# Patient Record
Sex: Female | Born: 1990 | Hispanic: Yes | Smoking: Never smoker
Health system: Southern US, Community
[De-identification: ages and names within clinical notes are randomized; demographics above are authoritative.]

## PROBLEM LIST (undated history)

## (undated) DIAGNOSIS — Z789 Other specified health status: Secondary | ICD-10-CM

## (undated) HISTORY — PX: NO PAST SURGERIES: SHX2092

---
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
## 2010-12-15 LAB — URINE MICROSCOPIC-ADD ON

## 2010-12-15 LAB — CBC
Hemoglobin: 11.7 g/dL — ABNORMAL LOW (ref 12.0–15.0)
Hemoglobin: 9.2 g/dL — ABNORMAL LOW (ref 12.0–15.0)
MCHC: 32.5 g/dL (ref 30.0–36.0)
MCHC: 33.2 g/dL (ref 30.0–36.0)
MCV: 83.3 fL (ref 78.0–100.0)
RBC: 4.34 MIL/uL (ref 3.87–5.11)
RDW: 14 % (ref 11.5–15.5)
WBC: 11.6 10*3/uL — ABNORMAL HIGH (ref 4.0–10.5)

## 2010-12-15 LAB — URINALYSIS, ROUTINE W REFLEX MICROSCOPIC
Bilirubin Urine: NEGATIVE
Ketones, ur: NEGATIVE mg/dL
Protein, ur: NEGATIVE mg/dL
Urobilinogen, UA: 0.2 mg/dL (ref 0.0–1.0)

## 2010-12-15 LAB — URINE CULTURE: Colony Count: 9000

## 2011-07-10 LAB — URINALYSIS, ROUTINE W REFLEX MICROSCOPIC
Bilirubin Urine: NEGATIVE
Glucose, UA: NEGATIVE
Ketones, ur: NEGATIVE
pH: 7.5

## 2011-07-10 LAB — CBC
Platelets: 319
RBC: 4.51
RDW: 12.9
WBC: 8.6

## 2011-07-10 LAB — URINE MICROSCOPIC-ADD ON

## 2011-07-10 LAB — DIFFERENTIAL
Basophils Absolute: 0
Basophils Relative: 0
Lymphocytes Relative: 15 — ABNORMAL LOW
Monocytes Absolute: 0.7
Neutro Abs: 6.1
Neutrophils Relative %: 71 — ABNORMAL HIGH

## 2011-07-10 LAB — BASIC METABOLIC PANEL
Chloride: 106
Potassium: 3.5
Sodium: 137

## 2011-09-10 ENCOUNTER — Encounter: Payer: Self-pay | Admitting: *Deleted

## 2011-09-10 ENCOUNTER — Emergency Department (HOSPITAL_COMMUNITY)
Admission: EM | Admit: 2011-09-10 | Discharge: 2011-09-10 | Disposition: A | Discharge status: Home / Self Care | Payer: Self-pay | Attending: Emergency Medicine | Admitting: Emergency Medicine

## 2011-09-10 DIAGNOSIS — R070 Pain in throat: Secondary | ICD-10-CM | POA: Insufficient documentation

## 2011-09-10 DIAGNOSIS — R220 Localized swelling, mass and lump, head: Secondary | ICD-10-CM | POA: Insufficient documentation

## 2011-09-10 DIAGNOSIS — J020 Streptococcal pharyngitis: Secondary | ICD-10-CM | POA: Insufficient documentation

## 2011-09-10 DIAGNOSIS — R599 Enlarged lymph nodes, unspecified: Secondary | ICD-10-CM | POA: Insufficient documentation

## 2011-09-10 MED ORDER — PENICILLIN G BENZATHINE 1200000 UNIT/2ML IM SUSP
1.2000 10*6.[IU] | INTRAMUSCULAR | Status: AC
  Administered 2011-09-10: 1.2 10*6.[IU] via INTRAMUSCULAR
  Filled 2011-09-10: qty 2

## 2011-09-10 NOTE — ED Notes (Signed)
Pt reports sore throat since Friday, last night felt like throat was swelling, had difficulty swallowing. Airway is intact at triage, resp e/u. Skin w/d.

## 2011-09-10 NOTE — ED Provider Notes (Addendum)
History     CSN: [id number] Arrival date & time: 09/10/2011  1:45 PM   First MD Initiated Contact with Patient 09/10/11 1439      Chief Complaint  Patient presents with  . Sore Throat    (Consider location/radiation/quality/duration/timing/severity/associated sxs/prior treatment) HPI  History reviewed. No pertinent past medical history.  History reviewed. No pertinent past surgical history.  History reviewed. No pertinent family history.  History  Substance Use Topics  . Smoking status: Never Smoker   . Smokeless tobacco: Not on file  . Alcohol Use: No    OB History    Grav Para Term Preterm Abortions TAB SAB Ect Mult Living                  Review of Systems  All other systems reviewed and are negative.    Allergies  Review of patient's allergies indicates no known allergies.  Home Medications   Current Outpatient Rx  Name Route Sig Dispense Refill  . HYPROMELLOSE 2.5 % OP SOLN Both Eyes Place 1 drop into both eyes daily as needed. For dry/irritated eyes       BP 127/76  Pulse 115  Temp(Src) 100.5 F (38.1 C) (Oral)  Resp 20  SpO2 97%  LMP 09/05/2011  Physical Exam  Nursing note and vitals reviewed. Constitutional: She is oriented to person, place, and time. She appears well-developed and well-nourished. No distress.  HENT:  Head: Normocephalic and atraumatic.  Mouth/Throat: Uvula is midline. Posterior oropharyngeal edema and posterior oropharyngeal erythema present. No tonsillar abscesses.  Eyes: Pupils are equal, round, and reactive to light.  Neck: Normal range of motion.  Cardiovascular: Normal rate and intact distal pulses.   Pulmonary/Chest: No respiratory distress.  Abdominal: Normal appearance. She exhibits no distension.  Musculoskeletal: Normal range of motion.  Lymphadenopathy:    She has cervical adenopathy.  Neurological: She is alert and oriented to person, place, and time. No cranial nerve deficit.  Skin: Skin is warm and dry.  No rash noted.  Psychiatric: She has a normal mood and affect. Her behavior is normal.    ED Course  Procedures (including critical care time)   Labs Reviewed  RAPID STREP SCREEN   No results found.  Centor Score =4 1. Strep pharyngitis       MDM  Will treat with IM Penecillin         Nelia Shi, MD 09/10/11 1500  Nelia Shi, MD 09/10/11 1501

## 2013-07-25 LAB — OB RESULTS CONSOLE HEPATITIS B SURFACE ANTIGEN: Hepatitis B Surface Ag: NEGATIVE

## 2013-07-25 LAB — OB RESULTS CONSOLE RUBELLA ANTIBODY, IGM: RUBELLA: IMMUNE

## 2013-09-25 NOTE — L&D Delivery Note (Signed)
Patient was C/C/+2and pushed for <10 minutes with epidural.   NSVD female infant, Apgars 9/9, weight pending.   The patient had no lacerations. Fundus was firm. EBL was expected. Placenta was delivered intact. Vagina was clear.  Baby was vigorous and doing skin to skin with mother.  Tilman Mcclaren  

## 2013-11-01 ENCOUNTER — Encounter (HOSPITAL_COMMUNITY): Payer: Medicaid Other | Admitting: Anesthesiology

## 2013-11-01 ENCOUNTER — Encounter (HOSPITAL_COMMUNITY): Payer: Self-pay | Admitting: *Deleted

## 2013-11-01 ENCOUNTER — Inpatient Hospital Stay (HOSPITAL_COMMUNITY): Payer: Medicaid Other | Admitting: Anesthesiology

## 2013-11-01 ENCOUNTER — Inpatient Hospital Stay (HOSPITAL_COMMUNITY)
Admission: AD | Admit: 2013-11-01 | Discharge: 2013-11-02 | DRG: 775 | Disposition: A | Discharge status: Home / Self Care | Payer: Medicaid Other | Source: Ambulatory Visit | Attending: Obstetrics and Gynecology | Admitting: Obstetrics and Gynecology

## 2013-11-01 DIAGNOSIS — O99892 Other specified diseases and conditions complicating childbirth: Principal | ICD-10-CM | POA: Diagnosis present

## 2013-11-01 DIAGNOSIS — O0930 Supervision of pregnancy with insufficient antenatal care, unspecified trimester: Secondary | ICD-10-CM

## 2013-11-01 DIAGNOSIS — Z22330 Carrier of Group B streptococcus: Secondary | ICD-10-CM

## 2013-11-01 DIAGNOSIS — O9989 Other specified diseases and conditions complicating pregnancy, childbirth and the puerperium: Principal | ICD-10-CM

## 2013-11-01 HISTORY — DX: Other specified health status: Z78.9

## 2013-11-01 LAB — CBC
HEMATOCRIT: 32.9 % — AB (ref 36.0–46.0)
Hemoglobin: 10.7 g/dL — ABNORMAL LOW (ref 12.0–15.0)
MCH: 25.7 pg — AB (ref 26.0–34.0)
MCHC: 32.5 g/dL (ref 30.0–36.0)
MCV: 79.1 fL (ref 78.0–100.0)
Platelets: 252 10*3/uL (ref 150–400)
RBC: 4.16 MIL/uL (ref 3.87–5.11)
RDW: 14.4 % (ref 11.5–15.5)
WBC: 14.9 10*3/uL — AB (ref 4.0–10.5)

## 2013-11-01 LAB — OB RESULTS CONSOLE HGB/HCT, BLOOD
HEMATOCRIT: 10 %
Hemoglobin: 33.9 g/dL

## 2013-11-01 LAB — OB RESULTS CONSOLE HIV ANTIBODY (ROUTINE TESTING): HIV: NONREACTIVE

## 2013-11-01 LAB — OB RESULTS CONSOLE ABO/RH: RH Type: POSITIVE

## 2013-11-01 LAB — OB RESULTS CONSOLE ANTIBODY SCREEN: Antibody Screen: NEGATIVE

## 2013-11-01 LAB — OB RESULTS CONSOLE GC/CHLAMYDIA
CHLAMYDIA, DNA PROBE: NEGATIVE
GC PROBE AMP, GENITAL: NEGATIVE

## 2013-11-01 LAB — RPR: RPR Ser Ql: NONREACTIVE

## 2013-11-01 LAB — OB RESULTS CONSOLE RPR: RPR: NONREACTIVE

## 2013-11-01 LAB — OB RESULTS CONSOLE GBS: STREP GROUP B AG: POSITIVE

## 2013-11-01 MED ORDER — IBUPROFEN 600 MG PO TABS: 600.0000 mg | ORAL_TABLET | Freq: Four times a day (QID) | ORAL | Status: DC | PRN

## 2013-11-01 MED ORDER — FENTANYL 2.5 MCG/ML BUPIVACAINE 1/10 % EPIDURAL INFUSION (WH - ANES)
INTRAMUSCULAR | Status: DC
Start: 2013-11-01 — End: 2013-11-01
  Filled 2013-11-01: qty 125

## 2013-11-01 MED ORDER — ACETAMINOPHEN 325 MG PO TABS: 650.0000 mg | ORAL_TABLET | ORAL | Status: DC | PRN

## 2013-11-01 MED ORDER — FLEET ENEMA 7-19 GM/118ML RE ENEM: 1.0000 | ENEMA | RECTAL | Status: DC | PRN

## 2013-11-01 MED ORDER — BENZOCAINE-MENTHOL 20-0.5 % EX AERO
1.0000 | INHALATION_SPRAY | CUTANEOUS | Status: DC | PRN
Start: 2013-11-01 — End: 2013-11-02
  Administered 2013-11-02: 1 via TOPICAL
  Filled 2013-11-01 (×2): qty 56

## 2013-11-01 MED ORDER — SIMETHICONE 80 MG PO CHEW: 80.0000 mg | CHEWABLE_TABLET | ORAL | Status: DC | PRN

## 2013-11-01 MED ORDER — LANOLIN HYDROUS EX OINT: TOPICAL_OINTMENT | CUTANEOUS | Status: DC | PRN

## 2013-11-01 MED ORDER — PENICILLIN G POTASSIUM 5000000 UNITS IJ SOLR
5.0000 10*6.[IU] | Freq: Once | INTRAMUSCULAR | Status: AC
  Administered 2013-11-01: 5 10*6.[IU] via INTRAVENOUS
  Filled 2013-11-01: qty 5

## 2013-11-01 MED ORDER — LIDOCAINE HCL (PF) 1 % IJ SOLN
30.0000 mL | INTRAMUSCULAR | Status: DC | PRN
  Filled 2013-11-01: qty 30

## 2013-11-01 MED ORDER — WITCH HAZEL-GLYCERIN EX PADS
1.0000 "application " | MEDICATED_PAD | CUTANEOUS | Status: DC | PRN
  Administered 2013-11-02: 1 via TOPICAL

## 2013-11-01 MED ORDER — ONDANSETRON HCL 4 MG/2ML IJ SOLN: 4.0000 mg | INTRAMUSCULAR | Status: DC | PRN

## 2013-11-01 MED ORDER — TETANUS-DIPHTH-ACELL PERTUSSIS 5-2.5-18.5 LF-MCG/0.5 IM SUSP
0.5000 mL | Freq: Once | INTRAMUSCULAR | Status: AC
  Administered 2013-11-02: 0.5 mL via INTRAMUSCULAR
  Filled 2013-11-01 (×2): qty 0.5

## 2013-11-01 MED ORDER — OXYCODONE-ACETAMINOPHEN 5-325 MG PO TABS: 1.0000 | ORAL_TABLET | ORAL | Status: DC | PRN

## 2013-11-01 MED ORDER — IBUPROFEN 600 MG PO TABS
600.0000 mg | ORAL_TABLET | Freq: Four times a day (QID) | ORAL | Status: DC
  Administered 2013-11-01 – 2013-11-02 (×4): 600 mg via ORAL
  Filled 2013-11-01 (×4): qty 1

## 2013-11-01 MED ORDER — LIDOCAINE HCL (PF) 1 % IJ SOLN
INTRAMUSCULAR | Status: DC | PRN
  Administered 2013-11-01: 9 mL
  Administered 2013-11-01: 8 mL

## 2013-11-01 MED ORDER — CITRIC ACID-SODIUM CITRATE 334-500 MG/5ML PO SOLN: 30.0000 mL | ORAL | Status: DC | PRN

## 2013-11-01 MED ORDER — LACTATED RINGERS IV SOLN
500.0000 mL | Freq: Once | INTRAVENOUS | Status: AC
  Administered 2013-11-01: 500 mL via INTRAVENOUS

## 2013-11-01 MED ORDER — PHENYLEPHRINE 40 MCG/ML (10ML) SYRINGE FOR IV PUSH (FOR BLOOD PRESSURE SUPPORT)
PREFILLED_SYRINGE | INTRAVENOUS | Status: AC
  Filled 2013-11-01: qty 10

## 2013-11-01 MED ORDER — FENTANYL 2.5 MCG/ML BUPIVACAINE 1/10 % EPIDURAL INFUSION (WH - ANES): 14.0000 mL/h | INTRAMUSCULAR | Status: DC | PRN

## 2013-11-01 MED ORDER — DIPHENHYDRAMINE HCL 50 MG/ML IJ SOLN: 12.5000 mg | INTRAMUSCULAR | Status: DC | PRN

## 2013-11-01 MED ORDER — EPHEDRINE 5 MG/ML INJ: 10.0000 mg | INTRAVENOUS | Status: DC | PRN

## 2013-11-01 MED ORDER — EPHEDRINE 5 MG/ML INJ
INTRAVENOUS | Status: AC
  Filled 2013-11-01: qty 4

## 2013-11-01 MED ORDER — ONDANSETRON HCL 4 MG PO TABS: 4.0000 mg | ORAL_TABLET | ORAL | Status: DC | PRN

## 2013-11-01 MED ORDER — OXYTOCIN BOLUS FROM INFUSION
500.0000 mL | INTRAVENOUS | Status: DC
  Administered 2013-11-01: 500 mL via INTRAVENOUS

## 2013-11-01 MED ORDER — OXYTOCIN 40 UNITS IN LACTATED RINGERS INFUSION - SIMPLE MED
62.5000 mL/h | INTRAVENOUS | Status: DC
  Filled 2013-11-01: qty 1000

## 2013-11-01 MED ORDER — FENTANYL 2.5 MCG/ML BUPIVACAINE 1/10 % EPIDURAL INFUSION (WH - ANES)
INTRAMUSCULAR | Status: DC | PRN
  Administered 2013-11-01: 14 mL/h via EPIDURAL

## 2013-11-01 MED ORDER — ONDANSETRON HCL 4 MG/2ML IJ SOLN: 4.0000 mg | Freq: Four times a day (QID) | INTRAMUSCULAR | Status: DC | PRN

## 2013-11-01 MED ORDER — DIPHENHYDRAMINE HCL 25 MG PO CAPS: 25.0000 mg | ORAL_CAPSULE | Freq: Four times a day (QID) | ORAL | Status: DC | PRN

## 2013-11-01 MED ORDER — ZOLPIDEM TARTRATE 5 MG PO TABS: 5.0000 mg | ORAL_TABLET | Freq: Every evening | ORAL | Status: DC | PRN

## 2013-11-01 MED ORDER — DIBUCAINE 1 % RE OINT
1.0000 "application " | TOPICAL_OINTMENT | RECTAL | Status: DC | PRN
  Administered 2013-11-02: 1 via RECTAL
  Filled 2013-11-01 (×2): qty 28

## 2013-11-01 MED ORDER — PRENATAL MULTIVITAMIN CH
1.0000 | ORAL_TABLET | Freq: Every day | ORAL | Status: DC
  Administered 2013-11-02: 1 via ORAL
  Filled 2013-11-01: qty 1

## 2013-11-01 MED ORDER — SENNOSIDES-DOCUSATE SODIUM 8.6-50 MG PO TABS
2.0000 | ORAL_TABLET | ORAL | Status: DC
  Administered 2013-11-01: 2 via ORAL
  Filled 2013-11-01: qty 2

## 2013-11-01 MED ORDER — PENICILLIN G POTASSIUM 5000000 UNITS IJ SOLR
2.5000 10*6.[IU] | INTRAVENOUS | Status: DC
  Filled 2013-11-01 (×3): qty 2.5

## 2013-11-01 MED ORDER — PHENYLEPHRINE 40 MCG/ML (10ML) SYRINGE FOR IV PUSH (FOR BLOOD PRESSURE SUPPORT): 80.0000 ug | PREFILLED_SYRINGE | INTRAVENOUS | Status: DC | PRN

## 2013-11-01 MED ORDER — LACTATED RINGERS IV SOLN: 500.0000 mL | INTRAVENOUS | Status: DC | PRN

## 2013-11-01 MED ORDER — LACTATED RINGERS IV SOLN
INTRAVENOUS | Status: DC
  Administered 2013-11-01: 14:00:00 via INTRAVENOUS

## 2013-11-01 NOTE — Anesthesia Procedure Notes (Signed)
Epidural Patient location during procedure: OB Start time: 11/01/2013 1:28 PM End time: 11/01/2013 1:32 PM  Staffing Anesthesiologist: Leilani AbleHATCHETT, Terrie Grajales Performed by: anesthesiologist   Preanesthetic Checklist Completed: patient identified, surgical consent, pre-op evaluation, timeout performed, IV checked, risks and benefits discussed and monitors and equipment checked  Epidural Patient position: sitting Prep: site prepped and draped and DuraPrep Patient monitoring: continuous pulse ox and blood pressure Approach: midline Injection technique: LOR air  Needle:  Needle type: Tuohy  Needle gauge: 17 G Needle length: 9 cm and 9 Needle insertion depth: 4 cm Catheter type: closed end flexible Catheter size: 19 Gauge Catheter at skin depth: 9 cm Test dose: negative and Other  Assessment Sensory level: T9 Events: blood not aspirated, injection not painful, no injection resistance, negative IV test and no paresthesia  Additional Notes Reason for block:procedure for pain

## 2013-11-01 NOTE — Anesthesia Preprocedure Evaluation (Signed)
Anesthesia Evaluation  Patient identified by MRN, date of birth, ID band Patient awake    Reviewed: Allergy & Precautions, H&P , NPO status , Patient's Chart, lab work & pertinent test results  Airway Mallampati: II TM Distance: >3 FB Neck ROM: full    Dental no notable dental hx.    Pulmonary neg pulmonary ROS,    Pulmonary exam normal       Cardiovascular negative cardio ROS      Neuro/Psych negative neurological ROS  negative psych ROS   GI/Hepatic negative GI ROS, Neg liver ROS,   Endo/Other  negative endocrine ROS  Renal/GU negative Renal ROS     Musculoskeletal   Abdominal Normal abdominal exam  (+)   Peds  Hematology negative hematology ROS (+)   Anesthesia Other Findings   Reproductive/Obstetrics (+) Pregnancy                           Anesthesia Physical Anesthesia Plan  ASA: II  Anesthesia Plan: Epidural   Post-op Pain Management:    Induction:   Airway Management Planned:   Additional Equipment:   Intra-op Plan:   Post-operative Plan:   Informed Consent: I have reviewed the patients History and Physical, chart, labs and discussed the procedure including the risks, benefits and alternatives for the proposed anesthesia with the patient or authorized representative who has indicated his/her understanding and acceptance.     Plan Discussed with:   Anesthesia Plan Comments:         Anesthesia Quick Evaluation  

## 2013-11-01 NOTE — Progress Notes (Signed)
[facility]. Newborn skin to skin with father.

## 2013-11-01 NOTE — H&P (Addendum)
23 y.o. 4022w6d  G3P1011 comes in c/o painful ctx.  Otherwise has good fetal movement and no bleeding.  Past Medical History  Diagnosis Date  . Medical history non-contributory     Past Surgical History  Procedure Laterality Date  . No past surgeries      OB History  Gravida Para Term Preterm AB SAB TAB Ectopic Multiple Living  3 1 1  0 1 1 0 0  1    # Outcome Date GA Lbr Len/2nd Weight Sex Delivery Anes PTL Lv  3 CUR           2 SAB           1 TRM               History   Social History  . Marital Status: Significant Other    Spouse Name: N/A    Number of Children: N/A  . Years of Education: N/A   Occupational History  . Not on file.   Social History Main Topics  . Smoking status: Never Smoker   . Smokeless tobacco: Never Used  . Alcohol Use: No  . Drug Use: No  . Sexual Activity: Yes    Birth Control/ Protection: None   Other Topics Concern  . Not on file   Social History Narrative  . No narrative on file   Review of patient's allergies indicates no known allergies.    Prenatal Transfer Tool  Maternal Diabetes: No Genetic Screening: Declined Maternal Ultrasounds/Referrals: Normal Fetal Ultrasounds or other Referrals:  None Maternal Substance Abuse:  No Significant Maternal Medications:  None Significant Maternal Lab Results: Lab values include: Group B Strep positive  Other PNC: uncomplicated, late prenatal care    Filed Vitals:   11/01/13 1333  BP:   Pulse:   Temp: 97.9 F (36.6 C)  Resp: 18     Lungs/Cor:  NAD Abdomen:  soft, gravid Ex:  no cords, erythema SVE:  4.5/90/-1 FHTs:  130, good STV, NST R Toco:  q3   A/P   Admit to L&D in labor  GBS Pos - PCN  Epidural when desired Other routine care  RaymondALLAHAN, Luther ParodySIDNEY

## 2013-11-02 LAB — CBC
HEMATOCRIT: 29.7 % — AB (ref 36.0–46.0)
HEMOGLOBIN: 9.7 g/dL — AB (ref 12.0–15.0)
MCH: 26.1 pg (ref 26.0–34.0)
MCHC: 32.7 g/dL (ref 30.0–36.0)
MCV: 79.8 fL (ref 78.0–100.0)
Platelets: 222 10*3/uL (ref 150–400)
RBC: 3.72 MIL/uL — AB (ref 3.87–5.11)
RDW: 14.8 % (ref 11.5–15.5)
WBC: 15.3 10*3/uL — ABNORMAL HIGH (ref 4.0–10.5)

## 2013-11-02 NOTE — Discharge Summary (Signed)
Obstetric Discharge Summary Reason for Admission: onset of labor Prenatal Procedures: ultrasound Intrapartum Procedures: spontaneous vaginal delivery Postpartum Procedures: none Complications-Operative and Postpartum: none Hemoglobin  Date Value Range Status  11/02/2013 9.7* 12.0 - 15.0 g/dL Final  1/6/10962/03/2014 04.533.9   Final     HCT  Date Value Range Status  11/02/2013 29.7* 36.0 - 46.0 % Final  11/01/2013 10   Final    Physical Exam:  General: alert and cooperative Lochia: appropriate Uterine Fundus: firm DVT Evaluation: No evidence of DVT seen on physical exam.  Discharge Diagnoses: Term Pregnancy-delivered  Discharge Information: Date: 11/02/2013 Activity: pelvic rest Diet: routine Medications: PNV and Ibuprofen Condition: stable Instructions: refer to practice specific booklet Discharge to: home Follow-up Information   Follow up with Russ Looper, DO In 4 weeks.   Specialty:  Obstetrics and Gynecology   Contact information:   [address] [place] [address] [phone]       Newborn Data: Live born female  Birth Weight: 6 lb 1.2 oz (2755 g) APGAR: 9, 9  Home with mother.  Kathryn Dudley, Gerado Nabers 11/02/2013, 1:57 PM

## 2013-11-02 NOTE — Progress Notes (Addendum)
Patient is eating, ambulating, voiding.  Pain control is good. Appropriate lochia.  No complaints.  Filed Vitals:   11/01/13 2040 11/01/13 2140 11/02/13 0140 11/02/13 0941  BP: 114/74 130/75 102/64 113/72  Pulse: 76 77 97 85  Temp: 98.5 F (36.9 C) 98.3 F (36.8 C) 98.2 F (36.8 C) 98.6 F (37 C)  TempSrc: Oral Oral Oral Oral  Resp: 20 20 20 20   Height:      Weight:      SpO2: 98% 99% 98%     Fundus firm, NT Perineum without swelling.  Lab Results  Component Value Date   WBC 15.3* 11/02/2013   HGB 9.7* 11/02/2013   HCT 29.7* 11/02/2013   MCV 79.8 11/02/2013   PLT 222 11/02/2013    A/Positive/-- (02/07 0000)  A/P Post partum day 1. Kathryn Dudley and baby both doing well.  Desires early d/c this evening.  Routine care.  Expect d/c 2/8.    Philip AspenALLAHAN, Jonnie Truxillo

## 2013-11-02 NOTE — Anesthesia Postprocedure Evaluation (Signed)
Anesthesia Post Note  Patient: Kathryn Dudley  Procedure(s) Performed: * No procedures listed *  Anesthesia type: Epidural  Patient location: Mother/Baby  Post pain: Pain level controlled  Post assessment: Post-op Vital signs reviewed  Last Vitals:  Filed Vitals:   11/02/13 0140  BP: 102/64  Pulse: 97  Temp: 36.8 C  Resp: 20    Post vital signs: Reviewed  Level of consciousness:alert  Complications: No apparent anesthesia complications

## 2014-07-27 ENCOUNTER — Encounter (HOSPITAL_COMMUNITY): Payer: Self-pay | Admitting: *Deleted

## 2015-05-26 LAB — OB RESULTS CONSOLE ABO/RH: RH Type: POSITIVE

## 2015-05-26 LAB — OB RESULTS CONSOLE ANTIBODY SCREEN: ANTIBODY SCREEN: NEGATIVE

## 2015-05-26 LAB — OB RESULTS CONSOLE HIV ANTIBODY (ROUTINE TESTING): HIV: NONREACTIVE

## 2015-05-26 LAB — OB RESULTS CONSOLE RUBELLA ANTIBODY, IGM: Rubella: IMMUNE

## 2015-05-26 LAB — OB RESULTS CONSOLE GC/CHLAMYDIA
Chlamydia: NEGATIVE
Gonorrhea: NEGATIVE

## 2015-05-26 LAB — OB RESULTS CONSOLE RPR: RPR: NONREACTIVE

## 2015-05-26 LAB — OB RESULTS CONSOLE HEPATITIS B SURFACE ANTIGEN: Hepatitis B Surface Ag: NEGATIVE

## 2015-09-26 NOTE — L&D Delivery Note (Signed)
Patient was C/C/+2 and pushed for <5 minutes with epidural.   NSVD female infant, Apgars 9/9, weight pending.   The patient had no laceration. Fundus was firm. EBL was expected amount. Placenta was delivered intact. Vagina was clear.  Baby was vigorous and doing skin to skin with mother.  Philip AspenALLAHAN, Kathryn Dudley

## 2015-12-02 ENCOUNTER — Other Ambulatory Visit: Payer: Self-pay | Admitting: Obstetrics and Gynecology

## 2015-12-03 ENCOUNTER — Telehealth (HOSPITAL_COMMUNITY): Payer: Self-pay | Admitting: *Deleted

## 2015-12-03 ENCOUNTER — Encounter (HOSPITAL_COMMUNITY): Payer: Self-pay | Admitting: *Deleted

## 2015-12-03 ENCOUNTER — Inpatient Hospital Stay (HOSPITAL_COMMUNITY)
Admission: AD | Admit: 2015-12-03 | Discharge: 2015-12-06 | DRG: 775 | Disposition: A | Discharge status: Home / Self Care | Payer: Medicaid Other | Source: Ambulatory Visit | Attending: Obstetrics and Gynecology | Admitting: Obstetrics and Gynecology

## 2015-12-03 ENCOUNTER — Inpatient Hospital Stay (HOSPITAL_COMMUNITY): Payer: Medicaid Other | Admitting: Anesthesiology

## 2015-12-03 DIAGNOSIS — O4292 Full-term premature rupture of membranes, unspecified as to length of time between rupture and onset of labor: Secondary | ICD-10-CM | POA: Diagnosis present

## 2015-12-03 DIAGNOSIS — O99824 Streptococcus B carrier state complicating childbirth: Secondary | ICD-10-CM | POA: Diagnosis present

## 2015-12-03 DIAGNOSIS — Z3A39 39 weeks gestation of pregnancy: Secondary | ICD-10-CM | POA: Diagnosis not present

## 2015-12-03 DIAGNOSIS — IMO0001 Reserved for inherently not codable concepts without codable children: Secondary | ICD-10-CM

## 2015-12-03 LAB — CBC
HEMATOCRIT: 36.4 % (ref 36.0–46.0)
Hemoglobin: 11.8 g/dL — ABNORMAL LOW (ref 12.0–15.0)
MCH: 26.3 pg (ref 26.0–34.0)
MCHC: 32.4 g/dL (ref 30.0–36.0)
MCV: 81.3 fL (ref 78.0–100.0)
PLATELETS: 255 10*3/uL (ref 150–400)
RBC: 4.48 MIL/uL (ref 3.87–5.11)
RDW: 14.1 % (ref 11.5–15.5)
WBC: 12.8 10*3/uL — AB (ref 4.0–10.5)

## 2015-12-03 LAB — TYPE AND SCREEN
ABO/RH(D): A POS
Antibody Screen: NEGATIVE

## 2015-12-03 LAB — ABO/RH: ABO/RH(D): A POS

## 2015-12-03 LAB — POCT FERN TEST: POCT Fern Test: POSITIVE

## 2015-12-03 LAB — OB RESULTS CONSOLE GBS: GBS: POSITIVE

## 2015-12-03 MED ORDER — DIPHENHYDRAMINE HCL 50 MG/ML IJ SOLN: 12.5000 mg | INTRAMUSCULAR | Status: DC | PRN

## 2015-12-03 MED ORDER — PHENYLEPHRINE 40 MCG/ML (10ML) SYRINGE FOR IV PUSH (FOR BLOOD PRESSURE SUPPORT)
80.0000 ug | PREFILLED_SYRINGE | INTRAVENOUS | Status: DC | PRN
  Filled 2015-12-03: qty 2

## 2015-12-03 MED ORDER — PHENYLEPHRINE 40 MCG/ML (10ML) SYRINGE FOR IV PUSH (FOR BLOOD PRESSURE SUPPORT): 80.0000 ug | PREFILLED_SYRINGE | INTRAVENOUS | Status: DC | PRN

## 2015-12-03 MED ORDER — CITRIC ACID-SODIUM CITRATE 334-500 MG/5ML PO SOLN
30.0000 mL | ORAL | Status: DC | PRN
Start: 2015-12-03 — End: 2015-12-04

## 2015-12-03 MED ORDER — EPHEDRINE 5 MG/ML INJ: 10.0000 mg | INTRAVENOUS | Status: DC | PRN

## 2015-12-03 MED ORDER — DEXTROSE 5 % IV SOLN
5.0000 10*6.[IU] | Freq: Once | INTRAVENOUS | Status: AC
  Administered 2015-12-03: 5 10*6.[IU] via INTRAVENOUS
  Filled 2015-12-03: qty 5

## 2015-12-03 MED ORDER — LIDOCAINE HCL (PF) 1 % IJ SOLN
INTRAMUSCULAR | Status: DC | PRN
  Administered 2015-12-03: 5 mL via EPIDURAL
  Administered 2015-12-03: 2 mL via EPIDURAL
  Administered 2015-12-03: 3 mL via EPIDURAL

## 2015-12-03 MED ORDER — OXYCODONE-ACETAMINOPHEN 5-325 MG PO TABS: 2.0000 | ORAL_TABLET | ORAL | Status: DC | PRN

## 2015-12-03 MED ORDER — FLEET ENEMA 7-19 GM/118ML RE ENEM
1.0000 | ENEMA | RECTAL | Status: DC | PRN
Start: 2015-12-03 — End: 2015-12-04

## 2015-12-03 MED ORDER — LACTATED RINGERS IV SOLN: 500.0000 mL | INTRAVENOUS | Status: DC | PRN

## 2015-12-03 MED ORDER — EPHEDRINE 5 MG/ML INJ
10.0000 mg | INTRAVENOUS | Status: DC | PRN
  Filled 2015-12-03: qty 2

## 2015-12-03 MED ORDER — LACTATED RINGERS IV SOLN: 500.0000 mL | Freq: Once | INTRAVENOUS | Status: AC

## 2015-12-03 MED ORDER — FENTANYL 2.5 MCG/ML BUPIVACAINE 1/10 % EPIDURAL INFUSION (WH - ANES)
INTRAMUSCULAR | Status: AC
  Administered 2015-12-03: 14 mL/h via EPIDURAL
  Filled 2015-12-03: qty 125

## 2015-12-03 MED ORDER — OXYCODONE-ACETAMINOPHEN 5-325 MG PO TABS
1.0000 | ORAL_TABLET | ORAL | Status: DC | PRN
Start: 2015-12-03 — End: 2015-12-04

## 2015-12-03 MED ORDER — LACTATED RINGERS IV SOLN
500.0000 mL | Freq: Once | INTRAVENOUS | Status: AC
  Administered 2015-12-03: 500 mL via INTRAVENOUS

## 2015-12-03 MED ORDER — OXYTOCIN BOLUS FROM INFUSION
500.0000 mL | INTRAVENOUS | Status: DC
  Administered 2015-12-04: 500 mL via INTRAVENOUS

## 2015-12-03 MED ORDER — ACETAMINOPHEN 325 MG PO TABS: 650.0000 mg | ORAL_TABLET | ORAL | Status: DC | PRN

## 2015-12-03 MED ORDER — PHENYLEPHRINE 40 MCG/ML (10ML) SYRINGE FOR IV PUSH (FOR BLOOD PRESSURE SUPPORT)
PREFILLED_SYRINGE | INTRAVENOUS | Status: AC
  Filled 2015-12-03: qty 20

## 2015-12-03 MED ORDER — PENICILLIN G POTASSIUM 5000000 UNITS IJ SOLR
2.5000 10*6.[IU] | INTRAVENOUS | Status: DC
  Filled 2015-12-03 (×7): qty 2.5

## 2015-12-03 MED ORDER — FENTANYL 2.5 MCG/ML BUPIVACAINE 1/10 % EPIDURAL INFUSION (WH - ANES)
14.0000 mL/h | INTRAMUSCULAR | Status: DC | PRN
Start: 2015-12-03 — End: 2015-12-04
  Administered 2015-12-03 (×2): 14 mL/h via EPIDURAL

## 2015-12-03 MED ORDER — LIDOCAINE HCL (PF) 1 % IJ SOLN
30.0000 mL | INTRAMUSCULAR | Status: DC | PRN
  Filled 2015-12-03: qty 30

## 2015-12-03 MED ORDER — ONDANSETRON HCL 4 MG/2ML IJ SOLN: 4.0000 mg | Freq: Four times a day (QID) | INTRAMUSCULAR | Status: DC | PRN

## 2015-12-03 MED ORDER — LACTATED RINGERS IV SOLN
INTRAVENOUS | Status: DC
  Administered 2015-12-03 (×2): via INTRAVENOUS

## 2015-12-03 MED ORDER — LACTATED RINGERS IV SOLN
2.5000 [IU]/h | INTRAVENOUS | Status: DC
  Filled 2015-12-03: qty 10

## 2015-12-03 NOTE — Anesthesia Preprocedure Evaluation (Signed)
Anesthesia Evaluation  Patient identified by MRN, date of birth, ID band Patient awake    Reviewed: Allergy & Precautions, NPO status , Patient's Chart, lab work & pertinent test results  History of Anesthesia Complications Negative for: history of anesthetic complications  Airway Mallampati: II  TM Distance: >3 FB Neck ROM: Full    Dental  (+) Teeth Intact, Dental Advisory Given   Pulmonary neg pulmonary ROS,    Pulmonary exam normal breath sounds clear to auscultation       Cardiovascular negative cardio ROS Normal cardiovascular exam Rhythm:Regular Rate:Normal     Neuro/Psych negative neurological ROS     GI/Hepatic negative GI ROS, Neg liver ROS,   Endo/Other  negative endocrine ROS  Renal/GU negative Renal ROS     Musculoskeletal negative musculoskeletal ROS (+)   Abdominal   Peds  Hematology negative hematology ROS (+)   Anesthesia Other Findings Day of surgery medications reviewed with the patient.  Reproductive/Obstetrics (+) Pregnancy                             Anesthesia Physical Anesthesia Plan  ASA: II  Anesthesia Plan: Epidural   Post-op Pain Management:    Induction:   Airway Management Planned:   Additional Equipment:   Intra-op Plan:   Post-operative Plan:   Informed Consent: I have reviewed the patients History and Physical, chart, labs and discussed the procedure including the risks, benefits and alternatives for the proposed anesthesia with the patient or authorized representative who has indicated his/her understanding and acceptance.   Dental advisory given  Plan Discussed with:   Anesthesia Plan Comments: (Patient identified. Risks/Benefits/Options discussed with patient including but not limited to bleeding, infection, nerve damage, paralysis, failed block, incomplete pain control, headache, blood pressure changes, nausea, vomiting, reactions to  medication both or allergic, itching and postpartum back pain. Confirmed with bedside nurse the patient's most recent platelet count. Confirmed with patient that they are not currently taking any anticoagulation, have any bleeding history or any family history of bleeding disorders. Patient expressed understanding and wished to proceed. All questions were answered. )        Anesthesia Quick Evaluation

## 2015-12-03 NOTE — H&P (Signed)
25 y.o. 3484w3d  G3P2002 comes in c/o LOF.  Otherwise has good fetal movement and no bleeding.  Past Medical History  Diagnosis Date  . Medical history non-contributory     Past Surgical History  Procedure Laterality Date  . No past surgeries      OB History  Gravida Para Term Preterm AB SAB TAB Ectopic Multiple Living  3 2 2  0 0 0 0 0  2    # Outcome Date GA Lbr Len/2nd Weight Sex Delivery Anes PTL Lv  3 Current           2 Term 11/01/13 641w6d 26:36 / 00:17 2.755 kg (6 lb 1.2 oz) F Vag-Spont EPI  Y  1 Term 2011   2.892 kg (6 lb 6 oz) F Vag-Spont EPI  Y      Social History   Social History  . Marital Status: Significant Other    Spouse Name: N/A  . Number of Children: N/A  . Years of Education: N/A   Occupational History  . Not on file.   Social History Main Topics  . Smoking status: Never Smoker   . Smokeless tobacco: Never Used  . Alcohol Use: No  . Drug Use: No  . Sexual Activity: Yes    Birth Control/ Protection: None   Other Topics Concern  . Not on file   Social History Narrative   Review of patient's allergies indicates no known allergies.    Prenatal Transfer Tool  Maternal Diabetes: No Genetic Screening: Normal Maternal Ultrasounds/Referrals: Normal Fetal Ultrasounds or other Referrals:  None Maternal Substance Abuse:  No Significant Maternal Medications:  None Significant Maternal Lab Results: Lab values include: Group B Strep positive  Other PNC: uncomplicated.    Filed Vitals:   12/03/15 2155 12/03/15 2156  BP: 131/67 121/63  Pulse: 80 78  Temp:    Resp:       Lungs/Cor:  NAD Abdomen:  soft, gravid Ex:  no cords, erythema SVE:  4/60/-3 FHTs:  130, good STV, NST R Toco:  q1-3   A/P   Admit with SROM  GBS Pos - PCN  Epidural when desired  Other routine care  [place], [facility]NEY

## 2015-12-03 NOTE — Progress Notes (Signed)
Dr. Ashok PallWouk [facility]  Dr. Claiborne Billingsallahan notified of pt arrival to MAU.  Notified of a G3P2 at 1770w3d.  Notified of FHR of 120 with accelerations.  Notified that pt is 4cm, 60%, -3, and vertex.  Notified that pt is Fern positive and GBS positive.  Provider states to put in admission orders.

## 2015-12-03 NOTE — MAU Note (Signed)
Pt states contractions started around 1800 yesterday.  Pt states she starting leaking around 0700 this morning.  The fluid became pink tinged about an hour ago.

## 2015-12-03 NOTE — Telephone Encounter (Signed)
Preadmission screen  

## 2015-12-03 NOTE — Anesthesia Procedure Notes (Signed)

## 2015-12-03 NOTE — Progress Notes (Signed)
Dr. Jonathon JordanGambino notified of pt arrival to MAU.  Notified of a G3P2 at 6574w3d.  Notified that contractions are 3-5 minutes apart.  Notified that FHR baseline is 120 with accelerations.  Notified that pt is 4cm, 60%, -3, and vertex.  Notified that pt is ruptured and is fern positive.  Notified that pt is GBS positive.  Provider states she will come see the pt.

## 2015-12-04 ENCOUNTER — Encounter (HOSPITAL_COMMUNITY): Payer: Self-pay | Admitting: *Deleted

## 2015-12-04 LAB — CBC
HEMATOCRIT: 34.2 % — AB (ref 36.0–46.0)
HEMOGLOBIN: 11 g/dL — AB (ref 12.0–15.0)
MCH: 26.2 pg (ref 26.0–34.0)
MCHC: 32.2 g/dL (ref 30.0–36.0)
MCV: 81.4 fL (ref 78.0–100.0)
Platelets: 251 10*3/uL (ref 150–400)
RBC: 4.2 MIL/uL (ref 3.87–5.11)
RDW: 14.2 % (ref 11.5–15.5)
WBC: 17 10*3/uL — AB (ref 4.0–10.5)

## 2015-12-04 LAB — RPR: RPR: NONREACTIVE

## 2015-12-04 MED ORDER — IBUPROFEN 600 MG PO TABS
600.0000 mg | ORAL_TABLET | Freq: Four times a day (QID) | ORAL | Status: DC
  Administered 2015-12-04 – 2015-12-06 (×10): 600 mg via ORAL
  Filled 2015-12-04 (×10): qty 1

## 2015-12-04 MED ORDER — ONDANSETRON HCL 4 MG PO TABS: 4.0000 mg | ORAL_TABLET | ORAL | Status: DC | PRN

## 2015-12-04 MED ORDER — SIMETHICONE 80 MG PO CHEW: 80.0000 mg | CHEWABLE_TABLET | ORAL | Status: DC | PRN

## 2015-12-04 MED ORDER — WITCH HAZEL-GLYCERIN EX PADS: 1.0000 "application " | MEDICATED_PAD | CUTANEOUS | Status: DC | PRN

## 2015-12-04 MED ORDER — ACETAMINOPHEN 325 MG PO TABS: 650.0000 mg | ORAL_TABLET | ORAL | Status: DC | PRN

## 2015-12-04 MED ORDER — LANOLIN HYDROUS EX OINT: TOPICAL_OINTMENT | CUTANEOUS | Status: DC | PRN

## 2015-12-04 MED ORDER — OXYCODONE-ACETAMINOPHEN 5-325 MG PO TABS
1.0000 | ORAL_TABLET | ORAL | Status: DC | PRN
  Administered 2015-12-04: 1 via ORAL
  Filled 2015-12-04: qty 1

## 2015-12-04 MED ORDER — ZOLPIDEM TARTRATE 5 MG PO TABS
5.0000 mg | ORAL_TABLET | Freq: Every evening | ORAL | Status: DC | PRN
Start: 2015-12-04 — End: 2015-12-06

## 2015-12-04 MED ORDER — DIPHENHYDRAMINE HCL 25 MG PO CAPS: 25.0000 mg | ORAL_CAPSULE | Freq: Four times a day (QID) | ORAL | Status: DC | PRN

## 2015-12-04 MED ORDER — PRENATAL MULTIVITAMIN CH
1.0000 | ORAL_TABLET | Freq: Every day | ORAL | Status: DC
  Administered 2015-12-04 – 2015-12-06 (×3): 1 via ORAL
  Filled 2015-12-04 (×3): qty 1

## 2015-12-04 MED ORDER — DIBUCAINE 1 % RE OINT: 1.0000 "application " | TOPICAL_OINTMENT | RECTAL | Status: DC | PRN

## 2015-12-04 MED ORDER — TETANUS-DIPHTH-ACELL PERTUSSIS 5-2.5-18.5 LF-MCG/0.5 IM SUSP: 0.5000 mL | Freq: Once | INTRAMUSCULAR | Status: DC

## 2015-12-04 MED ORDER — SENNOSIDES-DOCUSATE SODIUM 8.6-50 MG PO TABS
2.0000 | ORAL_TABLET | ORAL | Status: DC
  Administered 2015-12-05 (×2): 2 via ORAL
  Filled 2015-12-04 (×2): qty 2

## 2015-12-04 MED ORDER — BENZOCAINE-MENTHOL 20-0.5 % EX AERO
1.0000 "application " | INHALATION_SPRAY | CUTANEOUS | Status: DC | PRN
  Administered 2015-12-04: 1 via TOPICAL
  Filled 2015-12-04: qty 56

## 2015-12-04 MED ORDER — ONDANSETRON HCL 4 MG/2ML IJ SOLN: 4.0000 mg | INTRAMUSCULAR | Status: DC | PRN

## 2015-12-04 MED ORDER — OXYCODONE-ACETAMINOPHEN 5-325 MG PO TABS: 2.0000 | ORAL_TABLET | ORAL | Status: DC | PRN

## 2015-12-04 NOTE — Anesthesia Postprocedure Evaluation (Signed)
Anesthesia Post Note  Patient: Dorena BodoMaggie Hewes  Procedure(s) Performed: * No procedures listed *  Patient location during evaluation: Mother Baby Anesthesia Type: Epidural Level of consciousness: awake, awake and alert and oriented Pain management: pain level controlled Vital Signs Assessment: post-procedure vital signs reviewed and stable Respiratory status: spontaneous breathing Cardiovascular status: stable Postop Assessment: no headache, no backache, no signs of nausea or vomiting, patient able to bend at knees and adequate PO intake Anesthetic complications: no    Last Vitals:  Filed Vitals:   12/04/15 0455 12/04/15 0900  BP: 106/51 113/58  Pulse: 75 66  Temp: 36.4 C 37 C  Resp: 20 18    Last Pain:  Filed Vitals:   12/04/15 1156  PainSc: 2                  Isaias Dowson Hristova

## 2015-12-04 NOTE — Lactation Note (Addendum)
This note was copied from a baby's chart. Lactation Consultation Note  Patient Name: Kathryn Dudley OZHYQ'MToday's Date: 12/04/2015 Reason for consult: Initial assessment  Baby 8 hours old. Mom nursing baby when this LC entered the room. Mom reports that baby has been nursing off-and-on for the last hour since her bath. Baby is latched deeply, suckling rhythmically with intermittent swallows noted. Mom reports that she gave formula earlier because she did not think the baby was getting enough at the breast. However, mom states that her milk is starting to flow more now, and the baby appears satisfied at the breast. Discussed normal progression of milk coming to volume and supply and demand. Enc mom to continue to nurse with cues and call for assistance as needed. Mom denies any nipple/breast discomfort. [facility], aware of OP/BFSG and LC phone line assistance after D/C.  Maternal Data Does the patient have breastfeeding experience prior to this delivery?: Yes  Feeding Feeding Type: Breast Fed Length of feed: 30 min  LATCH Score/Interventions Latch: Grasps breast easily, tongue down, lips flanged, rhythmical sucking.  Audible Swallowing: A few with stimulation Intervention(s): Hand expression  Type of Nipple: Everted at rest and after stimulation  Comfort (Breast/Nipple): Soft / non-tender (advised EBM, olive or coconut oil instead of lanolin)     Hold (Positioning): No assistance needed to correctly position infant at breast.  LATCH Score: 9  Lactation Tools Discussed/Used     Consult Status Consult Status: Follow-up Date: 12/05/15 Follow-up type: In-patient    Geralynn OchsWILLIARD, Aradhana Gin 12/04/2015, 10:27 AM

## 2015-12-04 NOTE — Progress Notes (Signed)
Patient is eating, ambulating, voiding.  Pain control is good.  Appropriate lochia.  No complaints.  Filed Vitals:   12/04/15 0315 12/04/15 0350 12/04/15 0455 12/04/15 0900  BP: 115/65 109/58 106/51 113/58  Pulse: 79 65 75 66  Temp:  98 F (36.7 C) 97.6 F (36.4 C) 98.6 F (37 C)  TempSrc:  Oral Oral Oral  Resp:  18 20 18   Height:      Weight:      SpO2:        Fundus firm Perineum without swelling. No CT  Lab Results  Component Value Date   WBC 17.0* 12/04/2015   HGB 11.0* 12/04/2015   HCT 34.2* 12/04/2015   MCV 81.4 12/04/2015   PLT 251 12/04/2015    --/--/A POS, A POS (03/10 2055)  A/P Post partum day 0  Routine care.  Expect d/c 3/12.    Philip AspenALLAHAN, Titus Drone

## 2015-12-05 NOTE — Progress Notes (Signed)
Patient is eating, ambulating, voiding.  Pain control is good.  Appropriate lochia, no complaints.  Filed Vitals:   12/04/15 0455 12/04/15 0900 12/04/15 1902 12/05/15 0558  BP: 106/51 113/58 124/81 87/52  Pulse: 75 66 66 55  Temp: 97.6 F (36.4 C) 98.6 F (37 C) 98.2 F (36.8 C) 98.1 F (36.7 C)  TempSrc: Oral Oral Oral Oral  Resp: 20 18 18 18   Height:      Weight:      SpO2:   98%     Fundus firm Perineum without swelling. No CT  Lab Results  Component Value Date   WBC 17.0* 12/04/2015   HGB 11.0* 12/04/2015   HCT 34.2* 12/04/2015   MCV 81.4 12/04/2015   PLT 251 12/04/2015    --/--/A POS, A POS (03/10 2055)  A/P Post partum day 1. GBS+, baby to stay until monday  Routine care.  Expect d/c 3/13.    Philip AspenALLAHAN, Shanique Aslinger

## 2015-12-05 NOTE — Lactation Note (Addendum)
This note was copied from a baby's chart. Lactation Consultation Note  Patient Name: Girl Dorena BodoMaggie Graziosi EXBMW'UToday's Date: 12/05/2015 Reason for consult: Follow-up assessment Baby at 38 hr of life and mom reports bf is going well. She feels like her milk is changing and baby is latching better. Denies breast or nipple pain, no concerns voiced. She has offered formula today because she has had so many visitors that have wanted to hold and feed baby. She plans to stop by the store on the way home tomorrow to buy a DEBP so that she can pump to feed at home. Discussed engorgement treatment/prevention. She is aware of OP services and support group. She will call as needed.   Maternal Data    Feeding Length of feed: 25 min  LATCH Score/Interventions                      Lactation Tools Discussed/Used     Consult Status Consult Status: Follow-up Date: 12/06/15 Follow-up type: In-patient    Rulon Eisenmengerlizabeth E Jayjay Littles 12/05/2015, 4:25 PM

## 2015-12-06 MED ORDER — OXYCODONE-ACETAMINOPHEN 5-325 MG PO TABS: 2.0000 | ORAL_TABLET | ORAL | Status: DC | PRN

## 2015-12-06 MED ORDER — DOCUSATE SODIUM 100 MG PO CAPS: 100.0000 mg | ORAL_CAPSULE | Freq: Two times a day (BID) | ORAL | Status: AC

## 2015-12-06 MED ORDER — IBUPROFEN 600 MG PO TABS: 600.0000 mg | ORAL_TABLET | Freq: Four times a day (QID) | ORAL | Status: DC | PRN

## 2015-12-06 NOTE — Lactation Note (Signed)
This note was copied from a baby's chart. Lactation Consultation Note  Patient Name: Kathryn Dudley Reason for consult: Follow-up assessment;Breast/nipple pain Mom called for assist with latch and advised. Assisted Mom with positioning to obtain more depth and sustain depth with nursing. Mom is engorged, she is hand expressing or pre-pumping so nipple/aerola is compressible to help with latch.  Baby demonstrated good suckling bursts with swallows, breasts are softening with nursing and baby appeared satiated after nursing for 35 minutes. Mom's breast softer but still "tight" per Mom. Mom post pumped to comfort then applied ice packs. Encouraged Mom not to miss any feedings. When her breasts start to feel full, either BF or pump to prevent them from becoming to firm. Mom plans to purchase DEBP today after d/c home. Mom has Harmony Hand Pump to use. No bleeding from nipple with this feeding. Mom reported less discomfort and LC reviewed with Mom how to assess for good depth with latch. Mom denied other questions/concerns. Advised to call prn.   Maternal Data    Feeding Feeding Type: Breast Fed Length of feed: 35 min  LATCH Score/Interventions Latch: Grasps breast easily, tongue down, lips flanged, rhythmical sucking.  Audible Swallowing: Spontaneous and intermittent Intervention(s): Skin to skin;Hand expression Intervention(s): Hand expression;Skin to skin  Type of Nipple: Everted at rest and after stimulation  Comfort (Breast/Nipple): Engorged, cracked, bleeding, large blisters, severe discomfort Problem noted: Engorgment;Cracked, bleeding, blisters, bruises Intervention(s): Ice;Hand expression Intervention(s): Expressed breast milk to nipple  Problem noted: Cracked, bleeding, blisters, bruises Interventions  (Cracked/bleeding/bruising/blister): Hand pump;Expressed breast milk to nipple Interventions (Mild/moderate discomfort): Comfort gels  Hold  (Positioning): Assistance needed to correctly position infant at breast and maintain latch. Intervention(s): Breastfeeding basics reviewed;Support Pillows;Position options;Skin to skin  LATCH Score: 7  Lactation Tools Discussed/Used Tools: Pump Breast pump type: Manual   Consult Status Consult Status: Complete Date: 12/06/15 Follow-up type: In-patient    Kathryn Dudley, Kathryn Dudley Dudley, 11:41 AM

## 2015-12-06 NOTE — Discharge Summary (Signed)
Obstetric Discharge Summary Reason for Admission: rupture of membranes Prenatal Procedures: ultrasound Intrapartum Procedures: spontaneous vaginal delivery Postpartum Procedures: none Complications-Operative and Postpartum: none HEMOGLOBIN  Date Value Ref Range Status  12/04/2015 11.0* 12.0 - 15.0 g/dL Final  16/10/960402/03/2014 54.033.9 g/dL Final   HCT  Date Value Ref Range Status  12/04/2015 34.2* 36.0 - 46.0 % Final  11/01/2013 10 % Final    Physical Exam:  General: alert, cooperative and appears stated age 47Lochia: appropriate Uterine Fundus: firm   Discharge Diagnoses: Term Pregnancy-delivered  Discharge Information: Date: 12/06/2015 Activity: unrestricted and pelvic rest Diet: routine Medications: Ibuprofen, Colace and Percocet Condition: improved Instructions: refer to practice specific booklet Discharge to: home Follow-up Information    Follow up with CALLAHAN, SIDNEY, DO.   Specialty:  Obstetrics and Gynecology   Why:  For a postpartum evaluation   Contact information:   [address] [place] [address] [phone]       Newborn Data: Live born female  Birth Weight: 6 lb 9.6 oz (2995 g) APGAR: 9, 9  Home with mother.  Hoorain Kozakiewicz H. 12/06/2015, 8:46 AM

## 2015-12-06 NOTE — Lactation Note (Signed)
This note was copied from a baby's chart. Lactation Consultation Note  Patient Name: Kathryn Dudley ZOXWR'UToday's Date: 12/06/2015 Reason for consult: Follow-up assessment Baby just came off the breast with nursing for 10 minutes. Mom is engorged. RN had Mom hand express/pre-pump to help with latch. Mom has cracked nipples bilateral. Attempted to wake baby to nurse again, baby sleepy. Mom reports hearing swallows with baby at the breast. Mom used hand pump to post pump but left nipple started to bleed. Assisted Mom with hand expression to help soften breasts. Mom applied ice packs. Engorgement care reviewed with Mom and advised to wake baby to BF if breasts starting to fill, if baby will not nurse, pump to prevent breasts from becoming so full. Apply ice after each feeding. Advised to refer to page 24 Baby N Me booklet for engorgement care, page 25 for breast milk storage guidelines. Care for sore nipples reviewed, comfort gels given with instructions. LC left phone number for Mom to call next feeding for assist with latch due to nipple trauma. Advised Mom she will need to hand express or pre-pump to soften nipple/aerola before latching baby.   Maternal Data    Feeding Feeding Type: Breast Fed Length of feed: 10 min  LATCH Score/Interventions Latch: Grasps breast easily, tongue down, lips flanged, rhythmical sucking.  Audible Swallowing: Spontaneous and intermittent Intervention(s): Skin to skin;Hand expression Intervention(s): Hand expression;Skin to skin  Type of Nipple: Everted at rest and after stimulation  Comfort (Breast/Nipple): Engorged, cracked, bleeding, large blisters, severe discomfort Problem noted: Engorgment Intervention(s): Ice;Hand expression;Reverse pressure  Problem noted: Cracked, bleeding, blisters, bruises Interventions  (Cracked/bleeding/bruising/blister): Hand pump;Expressed breast milk to nipple Interventions (Mild/moderate discomfort): Comfort gels  Hold  (Positioning): Assistance needed to correctly position infant at breast and maintain latch.  LATCH Score: 8  Lactation Tools Discussed/Used Tools: Pump Breast pump type: Manual   Consult Status Consult Status: Complete Date: 12/06/15 Follow-up type: In-patient    Alfred LevinsGranger, Sherill Wegener Ann 12/06/2015, 9:14 AM

## 2015-12-09 ENCOUNTER — Inpatient Hospital Stay (HOSPITAL_COMMUNITY): Admission: RE | Admit: 2015-12-09 | Payer: Medicaid Other | Source: Ambulatory Visit

## 2018-02-05 ENCOUNTER — Telehealth: Payer: Self-pay

## 2018-02-05 NOTE — Telephone Encounter (Signed)
Rcvd call from Bernestine Amass from [facility]-[phone] on 01/31/18. Stating that Pt came there for Pregnancy test and it showed positive.[place] was done but showed no embryo,after [place] was done pt. Then told nurse that she had been passing blood clots 2 weeks ago and still spotting. Pt needs appt, called # on file no answer, so asked pt. To call us back to schedule appt.

## 2018-02-08 ENCOUNTER — Telehealth: Payer: Self-pay | Admitting: *Deleted

## 2018-02-08 NOTE — Telephone Encounter (Signed)
Received a voice message from 02/06/18 2:17pm from [facility] stating she is trying to follow up on a patient she referred named Dorena Bodo . States that Artavia seems to be really confused on what her next steps are- wants [place] to call her with update so she can explain to patient.   Per chart review do see call noted 02/05/18 but no follow up planned as patient wasn't reached. Called [facility] to notify we got referral but have not reached patient and to clarify the referral.  Was told they had asked patient if she had any pain, bleeding, cramping and patient denied but then while they were doing the ultrasound she admitted she had passed clots 2 weeks prior.  On [place] they did not see a gestational sac and suspected miscarriage.   I informed her we will assume care.  I  Reviewed chart with Dr. Nira Retort and she reccommends patient come in for stat bhcg .  I called patient twice , second time I was able to leave a message I am trying to reach her re: her referral to our office and to call us back today by 4pm  Or Monday after 8:15.  I also called [facility] and notified them I was unable to reach her; but if they talk to her to tell her to call us by 4pm , if having any severe pain to go to [facility].

## 2018-02-11 ENCOUNTER — Encounter: Payer: Self-pay | Admitting: *Deleted

## 2018-02-11 NOTE — Telephone Encounter (Signed)
I called Kathryn Dudley and left a voicemail notifying her she missed a lab appointment and is important she call us to reschedule. Will also send letter.

## 2019-10-08 ENCOUNTER — Other Ambulatory Visit: Payer: Self-pay

## 2019-10-08 ENCOUNTER — Encounter (HOSPITAL_COMMUNITY): Payer: Self-pay | Admitting: Emergency Medicine

## 2019-10-08 ENCOUNTER — Ambulatory Visit (HOSPITAL_COMMUNITY)
Admission: EM | Admit: 2019-10-08 | Discharge: 2019-10-08 | Disposition: A | Discharge status: Home / Self Care | Payer: HRSA Program | Attending: Family Medicine | Admitting: Family Medicine

## 2019-10-08 DIAGNOSIS — R519 Headache, unspecified: Secondary | ICD-10-CM | POA: Diagnosis present

## 2019-10-08 DIAGNOSIS — Z79899 Other long term (current) drug therapy: Secondary | ICD-10-CM | POA: Insufficient documentation

## 2019-10-08 DIAGNOSIS — U071 COVID-19: Secondary | ICD-10-CM | POA: Diagnosis not present

## 2019-10-08 MED ORDER — IBUPROFEN 600 MG PO TABS: 600.0000 mg | ORAL_TABLET | Freq: Three times a day (TID) | ORAL | 0 refills | Status: AC | PRN

## 2019-10-08 NOTE — ED Triage Notes (Signed)
Headache for 2 weeks, chills for a week.  Complains of eyes feeling very sore.   Reports temperature of 100.   Patient has had a runny nose for 2 days.

## 2019-10-08 NOTE — ED Provider Notes (Signed)
[place]    CSN: [id number] Arrival date & time: 10/08/19  1650      History   Chief Complaint Chief Complaint  Patient presents with  . Migraine    HPI Kathryn Dudley is a 29 y.o. female.   She is presenting with 2-week history of headache.  She feels that post orbital at times but then becomes generalized.  It ranges in intensity.  She does get some triggering when she is looking in the peripherals.  No photophobia.  No floaters.  No significant history of headaches.  Her sister has had similar symptoms.  She denies any possible exposure to Covid.  Temperature has been elevated recently.  No recent travel.  HPI  Past Medical History:  Diagnosis Date  . Medical history non-contributory     Patient Active Problem List   Diagnosis Date Noted  . Active labor 12/03/2015  . Normal delivery 11/01/2013    Past Surgical History:  Procedure Laterality Date  . NO PAST SURGERIES      OB History    Gravida  3   Para  3   Term  3   Preterm  0   AB  0   Living  3     SAB  0   TAB  0   Ectopic  0   Multiple  0   Live Births  3            Home Medications    Prior to Admission medications   Medication Sig Start Date End Date Taking? Authorizing Provider  fexofenadine (ALLEGRA) 30 MG/5ML suspension Take 30 mg by mouth daily.   Yes [provider]  Pseudoephedrine-APAP-DM (DAYQUIL PO) Take by mouth.   Yes [provider]  docusate sodium (COLACE) 100 MG capsule Take 1 capsule (100 mg total) by mouth 2 (two) times daily. 12/06/15   Vanessa Kick, MD  ferrous sulfate 325 (65 FE) MG tablet Take 325 mg by mouth daily with breakfast.    [provider]  ibuprofen (ADVIL) 600 MG tablet Take 1 tablet (600 mg total) by mouth every 8 (eight) hours as needed. 10/08/19   Rosemarie Ax, MD  oxyCODONE-acetaminophen (ROXICET) 5-325 MG tablet Take 2 tablets by mouth every 4 (four) hours as needed. May take 1-2 tablets every 4-6 hours  as needed for pain 12/06/15   Vanessa Kick, MD  Prenatal Vit-Fe Fumarate-FA (PRENATAL MULTIVITAMIN) TABS tablet Take 1 tablet by mouth every morning.    [provider]    Family History History reviewed. No pertinent family history.  Social History Social History   Tobacco Use  . Smoking status: Never Smoker  . Smokeless tobacco: Never Used  Substance Use Topics  . Alcohol use: Yes  . Drug use: No     Allergies   Patient has no known allergies.   Review of Systems Review of Systems See HPI  Physical Exam Triage Vital Signs ED Triage Vitals  Enc Vitals Group     BP 10/08/19 1736 124/85     Pulse Rate 10/08/19 1736 91     Resp 10/08/19 1736 18     Temp 10/08/19 1736 99.4 F (37.4 C)     Temp Source 10/08/19 1736 Oral     SpO2 10/08/19 1736 100 %     Weight --      Height --      Head Circumference --      Peak Flow --  Pain Score 10/08/19 1732 8     Pain Loc --      Pain Edu? --      Excl. in GC? --    No data found.  Updated Vital Signs BP 124/85 (BP Location: Right Arm)   Pulse 91   Temp 99.4 F (37.4 C) (Oral)   Resp 18   LMP 09/07/2019   SpO2 100%   Visual Acuity Right Eye Distance:   Left Eye Distance:   Bilateral Distance:    Right Eye Near:   Left Eye Near:    Bilateral Near:     Physical Exam Gen: NAD, alert, cooperative with exam, well-appearing ENT: normal lips, normal nasal mucosa,  Eye: normal EOM, normal conjunctiva and lids CV: Regular rate and rhythm, S1-S2 Resp: no accessory muscle use, non-labored,  Skin: no rashes, no areas of induration  Neuro: normal tone, normal sensation to touch Psych:  normal insight, alert and oriented MSK: Normal gait, normal strength  UC Treatments / Results  Labs (all labs ordered are listed, but only abnormal results are displayed) Labs Reviewed  NOVEL CORONAVIRUS, NAA (HOSP ORDER, SEND-OUT TO REF LAB; TAT 18-24 HRS)    EKG   Radiology No results  found.  Procedures Procedures (including critical care time)  Medications Ordered in UC Medications - No data to display  Initial Impression / Assessment and Plan / UC Course  I have reviewed the triage vital signs and the nursing notes.  Pertinent labs & imaging results that were available during my care of the patient were reviewed by me and considered in my medical decision making (see chart for details).     Ms. Kathryn Dudley is a 29 year old female is presenting with headache.  Possibly related Covid.  Seems less likely related to migraine.  Has been taken several different medications to try to control the headache.  Appears well on exam.  We will try ibuprofen and not take any other pain medications.  Counseled supportive care.  Sent out for Covid.  Counseled on quarantine.  Given indications to follow-up and return.  Final Clinical Impressions(s) / UC Diagnoses   Final diagnoses:  Nonintractable headache, unspecified chronicity pattern, unspecified headache type     Discharge Instructions     Please try the ibuprofen  Please stay well hydrated  Please quarantine until you find out your results.  Please follow up if your symptoms fail to improve.     ED Prescriptions    Medication Sig Dispense Auth. Provider   ibuprofen (ADVIL) 600 MG tablet Take 1 tablet (600 mg total) by mouth every 8 (eight) hours as needed. 30 tablet Myra Rude, MD     PDMP not reviewed this encounter.   Myra Rude, MD 10/08/19 [phone]

## 2019-10-08 NOTE — Discharge Instructions (Signed)
Please try the ibuprofen  Please stay well hydrated  Please quarantine until you find out your results.  Please follow up if your symptoms fail to improve.

## 2019-10-10 ENCOUNTER — Telehealth (HOSPITAL_COMMUNITY): Payer: Self-pay | Admitting: Emergency Medicine

## 2019-10-10 LAB — NOVEL CORONAVIRUS, NAA (HOSP ORDER, SEND-OUT TO REF LAB; TAT 18-24 HRS): SARS-CoV-2, NAA: DETECTED — AB

## 2019-10-10 NOTE — Telephone Encounter (Signed)

## 2020-04-25 DIAGNOSIS — Z419 Encounter for procedure for purposes other than remedying health state, unspecified: Secondary | ICD-10-CM | POA: Diagnosis not present

## 2020-05-26 DIAGNOSIS — Z419 Encounter for procedure for purposes other than remedying health state, unspecified: Secondary | ICD-10-CM | POA: Diagnosis not present

## 2020-06-25 DIAGNOSIS — Z419 Encounter for procedure for purposes other than remedying health state, unspecified: Secondary | ICD-10-CM | POA: Diagnosis not present

## 2020-07-26 DIAGNOSIS — Z419 Encounter for procedure for purposes other than remedying health state, unspecified: Secondary | ICD-10-CM | POA: Diagnosis not present

## 2020-08-25 DIAGNOSIS — Z419 Encounter for procedure for purposes other than remedying health state, unspecified: Secondary | ICD-10-CM | POA: Diagnosis not present

## 2020-09-25 DIAGNOSIS — Z419 Encounter for procedure for purposes other than remedying health state, unspecified: Secondary | ICD-10-CM | POA: Diagnosis not present

## 2020-10-26 DIAGNOSIS — Z419 Encounter for procedure for purposes other than remedying health state, unspecified: Secondary | ICD-10-CM | POA: Diagnosis not present

## 2020-10-27 DIAGNOSIS — Z01419 Encounter for gynecological examination (general) (routine) without abnormal findings: Secondary | ICD-10-CM | POA: Diagnosis not present

## 2020-10-27 DIAGNOSIS — Z113 Encounter for screening for infections with a predominantly sexual mode of transmission: Secondary | ICD-10-CM | POA: Diagnosis not present

## 2020-10-27 DIAGNOSIS — Z118 Encounter for screening for other infectious and parasitic diseases: Secondary | ICD-10-CM | POA: Diagnosis not present

## 2020-10-27 DIAGNOSIS — Z1152 Encounter for screening for COVID-19: Secondary | ICD-10-CM | POA: Diagnosis not present

## 2020-10-27 DIAGNOSIS — Z0001 Encounter for general adult medical examination with abnormal findings: Secondary | ICD-10-CM | POA: Diagnosis not present

## 2020-10-27 DIAGNOSIS — Z1151 Encounter for screening for human papillomavirus (HPV): Secondary | ICD-10-CM | POA: Diagnosis not present

## 2020-11-18 DIAGNOSIS — Z30430 Encounter for insertion of intrauterine contraceptive device: Secondary | ICD-10-CM | POA: Diagnosis not present

## 2020-11-18 DIAGNOSIS — Z3009 Encounter for other general counseling and advice on contraception: Secondary | ICD-10-CM | POA: Diagnosis not present

## 2020-11-18 DIAGNOSIS — Z3202 Encounter for pregnancy test, result negative: Secondary | ICD-10-CM | POA: Diagnosis not present

## 2020-11-23 DIAGNOSIS — Z419 Encounter for procedure for purposes other than remedying health state, unspecified: Secondary | ICD-10-CM | POA: Diagnosis not present

## 2020-12-24 DIAGNOSIS — Z419 Encounter for procedure for purposes other than remedying health state, unspecified: Secondary | ICD-10-CM | POA: Diagnosis not present

## 2021-01-23 DIAGNOSIS — Z419 Encounter for procedure for purposes other than remedying health state, unspecified: Secondary | ICD-10-CM | POA: Diagnosis not present

## 2021-02-23 DIAGNOSIS — Z419 Encounter for procedure for purposes other than remedying health state, unspecified: Secondary | ICD-10-CM | POA: Diagnosis not present

## 2021-03-25 DIAGNOSIS — Z419 Encounter for procedure for purposes other than remedying health state, unspecified: Secondary | ICD-10-CM | POA: Diagnosis not present

## 2021-04-25 DIAGNOSIS — Z419 Encounter for procedure for purposes other than remedying health state, unspecified: Secondary | ICD-10-CM | POA: Diagnosis not present

## 2021-05-26 DIAGNOSIS — Z419 Encounter for procedure for purposes other than remedying health state, unspecified: Secondary | ICD-10-CM | POA: Diagnosis not present

## 2021-06-25 DIAGNOSIS — Z419 Encounter for procedure for purposes other than remedying health state, unspecified: Secondary | ICD-10-CM | POA: Diagnosis not present

## 2021-07-26 DIAGNOSIS — Z419 Encounter for procedure for purposes other than remedying health state, unspecified: Secondary | ICD-10-CM | POA: Diagnosis not present

## 2021-08-25 DIAGNOSIS — Z419 Encounter for procedure for purposes other than remedying health state, unspecified: Secondary | ICD-10-CM | POA: Diagnosis not present

## 2021-09-25 DIAGNOSIS — Z419 Encounter for procedure for purposes other than remedying health state, unspecified: Secondary | ICD-10-CM | POA: Diagnosis not present

## 2021-10-26 DIAGNOSIS — Z419 Encounter for procedure for purposes other than remedying health state, unspecified: Secondary | ICD-10-CM | POA: Diagnosis not present

## 2021-11-23 DIAGNOSIS — Z419 Encounter for procedure for purposes other than remedying health state, unspecified: Secondary | ICD-10-CM | POA: Diagnosis not present

## 2021-12-24 DIAGNOSIS — Z419 Encounter for procedure for purposes other than remedying health state, unspecified: Secondary | ICD-10-CM | POA: Diagnosis not present

## 2022-01-23 DIAGNOSIS — Z419 Encounter for procedure for purposes other than remedying health state, unspecified: Secondary | ICD-10-CM | POA: Diagnosis not present

## 2022-02-23 DIAGNOSIS — Z419 Encounter for procedure for purposes other than remedying health state, unspecified: Secondary | ICD-10-CM | POA: Diagnosis not present

## 2022-03-17 ENCOUNTER — Other Ambulatory Visit: Payer: Self-pay

## 2022-03-17 ENCOUNTER — Encounter (HOSPITAL_COMMUNITY): Payer: Self-pay

## 2022-03-17 ENCOUNTER — Emergency Department (HOSPITAL_COMMUNITY)
Admission: EM | Admit: 2022-03-17 | Discharge: 2022-03-17 | Discharge status: Against Medical Advice | Payer: Medicaid Other | Attending: Emergency Medicine | Admitting: Emergency Medicine

## 2022-03-17 DIAGNOSIS — Z5321 Procedure and treatment not carried out due to patient leaving prior to being seen by health care provider: Secondary | ICD-10-CM | POA: Insufficient documentation

## 2022-03-17 DIAGNOSIS — S40022A Contusion of left upper arm, initial encounter: Secondary | ICD-10-CM | POA: Insufficient documentation

## 2022-03-17 DIAGNOSIS — S8012XA Contusion of left lower leg, initial encounter: Secondary | ICD-10-CM | POA: Insufficient documentation

## 2022-03-17 DIAGNOSIS — S8011XA Contusion of right lower leg, initial encounter: Secondary | ICD-10-CM | POA: Diagnosis not present

## 2022-03-17 DIAGNOSIS — Y92410 Unspecified street and highway as the place of occurrence of the external cause: Secondary | ICD-10-CM | POA: Insufficient documentation

## 2022-03-17 DIAGNOSIS — S40021A Contusion of right upper arm, initial encounter: Secondary | ICD-10-CM | POA: Diagnosis not present
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
## 2022-03-25 DIAGNOSIS — Z419 Encounter for procedure for purposes other than remedying health state, unspecified: Secondary | ICD-10-CM | POA: Diagnosis not present

## 2022-04-25 DIAGNOSIS — Z419 Encounter for procedure for purposes other than remedying health state, unspecified: Secondary | ICD-10-CM | POA: Diagnosis not present

## 2022-05-26 DIAGNOSIS — Z419 Encounter for procedure for purposes other than remedying health state, unspecified: Secondary | ICD-10-CM | POA: Diagnosis not present

## 2022-06-25 DIAGNOSIS — Z419 Encounter for procedure for purposes other than remedying health state, unspecified: Secondary | ICD-10-CM | POA: Diagnosis not present

## 2022-07-26 DIAGNOSIS — Z419 Encounter for procedure for purposes other than remedying health state, unspecified: Secondary | ICD-10-CM | POA: Diagnosis not present

## 2022-08-25 DIAGNOSIS — Z419 Encounter for procedure for purposes other than remedying health state, unspecified: Secondary | ICD-10-CM | POA: Diagnosis not present

## 2022-09-25 DIAGNOSIS — Z419 Encounter for procedure for purposes other than remedying health state, unspecified: Secondary | ICD-10-CM | POA: Diagnosis not present

## 2022-10-26 DIAGNOSIS — Z419 Encounter for procedure for purposes other than remedying health state, unspecified: Secondary | ICD-10-CM | POA: Diagnosis not present

## 2022-11-24 DIAGNOSIS — Z419 Encounter for procedure for purposes other than remedying health state, unspecified: Secondary | ICD-10-CM | POA: Diagnosis not present

## 2022-12-25 DIAGNOSIS — Z419 Encounter for procedure for purposes other than remedying health state, unspecified: Secondary | ICD-10-CM | POA: Diagnosis not present

## 2023-01-24 DIAGNOSIS — Z419 Encounter for procedure for purposes other than remedying health state, unspecified: Secondary | ICD-10-CM | POA: Diagnosis not present

## 2023-02-24 DIAGNOSIS — Z419 Encounter for procedure for purposes other than remedying health state, unspecified: Secondary | ICD-10-CM | POA: Diagnosis not present

## 2023-03-26 DIAGNOSIS — Z419 Encounter for procedure for purposes other than remedying health state, unspecified: Secondary | ICD-10-CM | POA: Diagnosis not present

## 2023-04-26 DIAGNOSIS — Z419 Encounter for procedure for purposes other than remedying health state, unspecified: Secondary | ICD-10-CM | POA: Diagnosis not present

## 2023-04-30 DIAGNOSIS — Z3009 Encounter for other general counseling and advice on contraception: Secondary | ICD-10-CM | POA: Diagnosis not present

## 2023-04-30 DIAGNOSIS — R634 Abnormal weight loss: Secondary | ICD-10-CM | POA: Diagnosis not present

## 2023-05-27 DIAGNOSIS — Z419 Encounter for procedure for purposes other than remedying health state, unspecified: Secondary | ICD-10-CM | POA: Diagnosis not present

## 2023-06-26 DIAGNOSIS — Z419 Encounter for procedure for purposes other than remedying health state, unspecified: Secondary | ICD-10-CM | POA: Diagnosis not present

## 2023-07-27 DIAGNOSIS — Z419 Encounter for procedure for purposes other than remedying health state, unspecified: Secondary | ICD-10-CM | POA: Diagnosis not present

## 2023-08-26 DIAGNOSIS — Z419 Encounter for procedure for purposes other than remedying health state, unspecified: Secondary | ICD-10-CM | POA: Diagnosis not present

## 2023-09-26 DIAGNOSIS — Z419 Encounter for procedure for purposes other than remedying health state, unspecified: Secondary | ICD-10-CM | POA: Diagnosis not present

## 2023-10-27 ENCOUNTER — Emergency Department (HOSPITAL_BASED_OUTPATIENT_CLINIC_OR_DEPARTMENT_OTHER): Payer: Medicaid Other

## 2023-10-27 ENCOUNTER — Emergency Department (HOSPITAL_BASED_OUTPATIENT_CLINIC_OR_DEPARTMENT_OTHER)
Admission: EM | Admit: 2023-10-27 | Discharge: 2023-10-28 | Disposition: A | Discharge status: Home / Self Care | Payer: Medicaid Other | Attending: Emergency Medicine | Admitting: Emergency Medicine

## 2023-10-27 DIAGNOSIS — R609 Edema, unspecified: Secondary | ICD-10-CM | POA: Diagnosis not present

## 2023-10-27 DIAGNOSIS — Y906 Blood alcohol level of 120-199 mg/100 ml: Secondary | ICD-10-CM | POA: Diagnosis not present

## 2023-10-27 DIAGNOSIS — S52502A Unspecified fracture of the lower end of left radius, initial encounter for closed fracture: Secondary | ICD-10-CM | POA: Diagnosis not present

## 2023-10-27 DIAGNOSIS — M25532 Pain in left wrist: Secondary | ICD-10-CM | POA: Diagnosis not present

## 2023-10-27 DIAGNOSIS — S0990XA Unspecified injury of head, initial encounter: Secondary | ICD-10-CM | POA: Diagnosis not present

## 2023-10-27 DIAGNOSIS — S52572A Other intraarticular fracture of lower end of left radius, initial encounter for closed fracture: Secondary | ICD-10-CM | POA: Diagnosis not present

## 2023-10-27 DIAGNOSIS — Z419 Encounter for procedure for purposes other than remedying health state, unspecified: Secondary | ICD-10-CM | POA: Diagnosis not present

## 2023-10-27 DIAGNOSIS — I10 Essential (primary) hypertension: Secondary | ICD-10-CM | POA: Diagnosis not present

## 2023-10-27 DIAGNOSIS — M79642 Pain in left hand: Secondary | ICD-10-CM | POA: Diagnosis present

## 2023-10-27 DIAGNOSIS — R000 Tachycardia, unspecified: Secondary | ICD-10-CM | POA: Diagnosis not present

## 2023-10-27 DIAGNOSIS — R270 Ataxia, unspecified: Secondary | ICD-10-CM | POA: Diagnosis not present

## 2023-10-27 DIAGNOSIS — S0083XA Contusion of other part of head, initial encounter: Secondary | ICD-10-CM | POA: Diagnosis not present

## 2023-10-27 DIAGNOSIS — S199XXA Unspecified injury of neck, initial encounter: Secondary | ICD-10-CM | POA: Diagnosis not present

## 2023-10-27 DIAGNOSIS — F10120 Alcohol abuse with intoxication, uncomplicated: Secondary | ICD-10-CM | POA: Insufficient documentation

## 2023-10-27 DIAGNOSIS — F10920 Alcohol use, unspecified with intoxication, uncomplicated: Secondary | ICD-10-CM

## 2023-10-27 LAB — CBC WITH DIFFERENTIAL/PLATELET
Abs Immature Granulocytes: 0.05 10*3/uL (ref 0.00–0.07)
Basophils Absolute: 0.1 10*3/uL (ref 0.0–0.1)
Basophils Relative: 0 %
Eosinophils Absolute: 0 10*3/uL (ref 0.0–0.5)
Eosinophils Relative: 0 %
HCT: 43.2 % (ref 36.0–46.0)
Hemoglobin: 13.6 g/dL (ref 12.0–15.0)
Immature Granulocytes: 0 %
Lymphocytes Relative: 14 %
Lymphs Abs: 1.6 10*3/uL (ref 0.7–4.0)
MCH: 26.9 pg (ref 26.0–34.0)
MCHC: 31.5 g/dL (ref 30.0–36.0)
MCV: 85.5 fL (ref 80.0–100.0)
Monocytes Absolute: 0.6 10*3/uL (ref 0.1–1.0)
Monocytes Relative: 5 %
Neutro Abs: 9.3 10*3/uL — ABNORMAL HIGH (ref 1.7–7.7)
Neutrophils Relative %: 81 %
Platelets: 303 10*3/uL (ref 150–400)
RBC: 5.05 MIL/uL (ref 3.87–5.11)
RDW: 13.4 % (ref 11.5–15.5)
WBC: 11.6 10*3/uL — ABNORMAL HIGH (ref 4.0–10.5)
nRBC: 0 % (ref 0.0–0.2)

## 2023-10-27 LAB — BASIC METABOLIC PANEL
Anion gap: 11 (ref 5–15)
BUN: 9 mg/dL (ref 6–20)
CO2: 25 mmol/L (ref 22–32)
Calcium: 9.4 mg/dL (ref 8.9–10.3)
Chloride: 107 mmol/L (ref 98–111)
Creatinine, Ser: 0.83 mg/dL (ref 0.44–1.00)
GFR, Estimated: >60 mL/min (ref >60)
Glucose, Bld: 82 mg/dL (ref 70–99)
Potassium: 3.5 mmol/L (ref 3.5–5.1)
Sodium: 143 mmol/L (ref 135–145)

## 2023-10-27 LAB — ETHANOL: Alcohol, Ethyl (B): 168 mg/dL — ABNORMAL HIGH (ref <10)

## 2023-10-27 LAB — HCG, QUANTITATIVE, PREGNANCY: hCG, Beta Chain, Quant, S: <1 m[IU]/mL (ref <5)

## 2023-10-27 MED ORDER — ONDANSETRON HCL 4 MG PO TABS
4.0000 mg | ORAL_TABLET | Freq: Once | ORAL | Status: AC
  Administered 2023-10-27: 4 mg via ORAL
  Filled 2023-10-27: qty 1

## 2023-10-27 MED ORDER — OXYCODONE HCL 5 MG PO TABS
5.0000 mg | ORAL_TABLET | Freq: Once | ORAL | Status: AC
  Administered 2023-10-27: 5 mg via ORAL
  Filled 2023-10-27: qty 1

## 2023-10-27 NOTE — ED Notes (Signed)
Pt ambulatory to RR w assistance from family. Even, steady gait

## 2023-10-27 NOTE — ED Notes (Signed)
Ice bags given to pt

## 2023-10-27 NOTE — ED Notes (Signed)
Pt removed ccollar. Advised of need until cspine was cleared, refusing to put back on.
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
## 2023-10-28 MED ORDER — OXYCODONE-ACETAMINOPHEN 5-325 MG PO TABS: 1.0000 | ORAL_TABLET | ORAL | 0 refills | Status: AC | PRN

## 2023-10-28 MED ORDER — OXYCODONE HCL 5 MG PO TABS: 5.0000 mg | ORAL_TABLET | ORAL | 0 refills | Status: AC | PRN

## 2023-10-28 MED ORDER — OXYCODONE HCL 5 MG PO TABS
5.0000 mg | ORAL_TABLET | Freq: Once | ORAL | Status: AC
  Administered 2023-10-28: 5 mg via ORAL
  Filled 2023-10-28: qty 1

## 2023-10-28 MED ORDER — IBUPROFEN 400 MG PO TABS
400.0000 mg | ORAL_TABLET | Freq: Once | ORAL | Status: AC
  Administered 2023-10-28: 400 mg via ORAL
  Filled 2023-10-28: qty 1

## 2023-10-28 NOTE — Discharge Instructions (Addendum)
Apply ice to all painful or swollen areas.  Ice should be applied for 30 minutes at a time, 4 times a day.  Take ibuprofen or naproxen as needed for pain.  If you need additional pain relief, add acetaminophen.  If you still need additional pain relief, add oxycodone.

## 2023-10-30 DIAGNOSIS — S52552A Other extraarticular fracture of lower end of left radius, initial encounter for closed fracture: Secondary | ICD-10-CM | POA: Diagnosis not present

## 2023-11-20 DIAGNOSIS — S52552D Other extraarticular fracture of lower end of left radius, subsequent encounter for closed fracture with routine healing: Secondary | ICD-10-CM | POA: Diagnosis not present

## 2023-11-24 DIAGNOSIS — Z419 Encounter for procedure for purposes other than remedying health state, unspecified: Secondary | ICD-10-CM | POA: Diagnosis not present

## 2023-12-18 DIAGNOSIS — S52552D Other extraarticular fracture of lower end of left radius, subsequent encounter for closed fracture with routine healing: Secondary | ICD-10-CM | POA: Diagnosis not present

## 2024-01-05 DIAGNOSIS — Z419 Encounter for procedure for purposes other than remedying health state, unspecified: Secondary | ICD-10-CM | POA: Diagnosis not present

## 2024-01-17 DIAGNOSIS — S52552D Other extraarticular fracture of lower end of left radius, subsequent encounter for closed fracture with routine healing: Secondary | ICD-10-CM | POA: Diagnosis not present

## 2024-02-04 DIAGNOSIS — Z419 Encounter for procedure for purposes other than remedying health state, unspecified: Secondary | ICD-10-CM | POA: Diagnosis not present

## 2024-03-06 DIAGNOSIS — Z419 Encounter for procedure for purposes other than remedying health state, unspecified: Secondary | ICD-10-CM | POA: Diagnosis not present

## 2024-04-05 DIAGNOSIS — Z419 Encounter for procedure for purposes other than remedying health state, unspecified: Secondary | ICD-10-CM | POA: Diagnosis not present

## 2024-05-06 DIAGNOSIS — Z419 Encounter for procedure for purposes other than remedying health state, unspecified: Secondary | ICD-10-CM | POA: Diagnosis not present

## 2024-06-06 DIAGNOSIS — Z419 Encounter for procedure for purposes other than remedying health state, unspecified: Secondary | ICD-10-CM | POA: Diagnosis not present
